# Patient Record
Sex: Female | Born: 1955 | Race: White | Hispanic: No | Smoking: Never smoker
Health system: Southern US, Community
[De-identification: ages and names within clinical notes are randomized; demographics above are authoritative.]

## PROBLEM LIST (undated history)

## (undated) DIAGNOSIS — J449 Chronic obstructive pulmonary disease, unspecified: Secondary | ICD-10-CM

## (undated) HISTORY — PX: ABDOMINAL SURGERY: SHX537

## (undated) HISTORY — PX: FRACTURE SURGERY: SHX138

## (undated) HISTORY — PX: TONSILLECTOMY: SUR1361

## (undated) HISTORY — PX: APPENDECTOMY: SHX54

## (undated) HISTORY — PX: ABDOMINAL HYSTERECTOMY: SHX81

---

## 2011-02-17 ENCOUNTER — Emergency Department (HOSPITAL_COMMUNITY): Payer: Self-pay

## 2011-02-17 ENCOUNTER — Emergency Department (HOSPITAL_COMMUNITY)
Admission: EM | Admit: 2011-02-17 | Discharge: 2011-02-17 | Disposition: A | Discharge status: Home / Self Care | Payer: Self-pay | Attending: Emergency Medicine | Admitting: Emergency Medicine

## 2011-02-17 ENCOUNTER — Encounter (HOSPITAL_COMMUNITY): Payer: Self-pay | Admitting: Emergency Medicine

## 2011-02-17 DIAGNOSIS — J4489 Other specified chronic obstructive pulmonary disease: Secondary | ICD-10-CM | POA: Insufficient documentation

## 2011-02-17 DIAGNOSIS — R351 Nocturia: Secondary | ICD-10-CM | POA: Insufficient documentation

## 2011-02-17 DIAGNOSIS — N390 Urinary tract infection, site not specified: Secondary | ICD-10-CM | POA: Insufficient documentation

## 2011-02-17 DIAGNOSIS — J449 Chronic obstructive pulmonary disease, unspecified: Secondary | ICD-10-CM | POA: Insufficient documentation

## 2011-02-17 DIAGNOSIS — R109 Unspecified abdominal pain: Secondary | ICD-10-CM | POA: Insufficient documentation

## 2011-02-17 DIAGNOSIS — Z87442 Personal history of urinary calculi: Secondary | ICD-10-CM | POA: Insufficient documentation

## 2011-02-17 DIAGNOSIS — R350 Frequency of micturition: Secondary | ICD-10-CM | POA: Insufficient documentation

## 2011-02-17 DIAGNOSIS — M545 Low back pain, unspecified: Secondary | ICD-10-CM | POA: Insufficient documentation

## 2011-02-17 DIAGNOSIS — R1032 Left lower quadrant pain: Secondary | ICD-10-CM | POA: Insufficient documentation

## 2011-02-17 HISTORY — DX: Chronic obstructive pulmonary disease, unspecified: J44.9

## 2011-02-17 LAB — URINALYSIS, ROUTINE W REFLEX MICROSCOPIC
Bilirubin Urine: NEGATIVE
Glucose, UA: NEGATIVE mg/dL
Hgb urine dipstick: NEGATIVE
Specific Gravity, Urine: 1.019 (ref 1.005–1.030)
pH: 6.5 (ref 5.0–8.0)

## 2011-02-17 LAB — URINE MICROSCOPIC-ADD ON

## 2011-02-17 MED ORDER — KETOROLAC TROMETHAMINE 60 MG/2ML IM SOLN
60.0000 mg | Freq: Once | INTRAMUSCULAR | Status: AC
  Administered 2011-02-17: 60 mg via INTRAMUSCULAR
  Filled 2011-02-17: qty 2

## 2011-02-17 MED ORDER — CYCLOBENZAPRINE HCL 5 MG PO TABS: 5.0000 mg | ORAL_TABLET | Freq: Three times a day (TID) | ORAL | Status: AC | PRN

## 2011-02-17 MED ORDER — CEPHALEXIN 500 MG PO CAPS: 500.0000 mg | ORAL_CAPSULE | Freq: Three times a day (TID) | ORAL | Status: AC

## 2011-02-17 MED ORDER — PHENAZOPYRIDINE HCL 200 MG PO TABS: 200.0000 mg | ORAL_TABLET | Freq: Three times a day (TID) | ORAL | Status: AC

## 2011-02-17 MED ORDER — PROMETHAZINE HCL 25 MG PO TABS: 25.0000 mg | ORAL_TABLET | Freq: Three times a day (TID) | ORAL | Status: AC | PRN

## 2011-02-17 NOTE — ED Notes (Signed)
The pt has had lt flank  And lt lower abd pain for 3 weeks.  Worse for the past 2  days

## 2011-02-17 NOTE — ED Provider Notes (Signed)
History     CSN: [id number]  Arrival date & time 02/17/11  1339   First MD Initiated Contact with Patient 02/17/11 1532      Chief Complaint  Patient presents with  . Abdominal Pain    (Consider location/radiation/quality/duration/timing/severity/associated sxs/prior treatment) HPI  Patient relates for the past 3 weeks she has been having pain in her lower back, she states she does not have dysuria but she has severe pain after she urinates. She states her urine has a very foul odor and she is having marked frequency and nocturia. She denies nausea, vomiting, fever. She also relates she's having pain in her left flank it's radiating around to her left lower quadrant that is not related to anything she does although sometimes when she moves it hurts more. She states she did have a kidney stone once over 30 years ago while she was pregnant. She's been taking cranberry juice and a zone of the counter without improvement.  ECP Dr. Brock Bad   Past Medical History  Diagnosis Date  . COPD (chronic obstructive pulmonary disease)     Past Surgical History  Procedure Date  . Abdominal surgery   . Abdominal hysterectomy   . Appendectomy   . Tonsillectomy   . Fracture surgery    bladder sling  No family history on file.  History  Substance Use Topics  . Smoking status: Never Smoker   . Smokeless tobacco: Not on file  . Alcohol Use: No    OB History    Grav Para Term Preterm Abortions TAB SAB Ect Mult Living                  Review of Systems  All other systems reviewed and are negative.    Allergies  Hydrocodone  Home Medications  No current outpatient prescriptions on file.  BP 124/73  Pulse 90  Temp(Src) 97.8 F (36.6 C) (Oral)  Resp 20  SpO2 96%  Vital signs normal    Physical Exam  Constitutional: She is oriented to person, place, and time. She appears well-developed and well-nourished.  Non-toxic appearance. She does not appear ill. No distress.  HENT:   Head: Normocephalic and atraumatic.  Right Ear: External ear normal.  Left Ear: External ear normal.  Nose: Nose normal. No mucosal edema or rhinorrhea.  Mouth/Throat: Oropharynx is clear and moist and mucous membranes are normal. No dental abscesses or uvula swelling.  Eyes: Conjunctivae and EOM are normal. Pupils are equal, round, and reactive to light.  Neck: Normal range of motion and full passive range of motion without pain. Neck supple.  Cardiovascular: Normal rate, regular rhythm and normal heart sounds.  Exam reveals no gallop and no friction rub.   No murmur heard. Pulmonary/Chest: Effort normal and breath sounds normal. No respiratory distress. She has no wheezes. She has no rhonchi. She has no rales. She exhibits no tenderness and no crepitus.  Abdominal: Soft. Normal appearance and bowel sounds are normal. She exhibits no distension. There is tenderness. There is no rebound and no guarding.       Patient has mild tenderness in her left lower quadrant. She has some tenderness in her left flank.  Musculoskeletal: Normal range of motion. She exhibits no edema and no tenderness.       Moves all extremities well.   Neurological: She is alert and oriented to person, place, and time. She has normal strength. No cranial nerve deficit.  Skin: Skin is warm, dry and intact. No rash  noted. No erythema. No pallor.  Psychiatric: She has a normal mood and affect. Her speech is normal and behavior is normal. Her mood appears not anxious.    ED Course  Procedures (including critical care time)  Patient given Toradol 60 mg IM for her flank pain.  Review of [place] controlled substance site shows she has hydrocodone filled in August 2012 #:15 and in October 2012 #15  Results for orders placed during the hospital encounter of 02/17/11  URINALYSIS, ROUTINE W REFLEX MICROSCOPIC      Component Value Range   Color, Urine YELLOW  YELLOW    APPearance CLOUDY (*) CLEAR    Specific Gravity,  Urine 1.019  1.005 - 1.030    pH 6.5  5.0 - 8.0    Glucose, UA NEGATIVE  NEGATIVE (mg/dL)   Hgb urine dipstick NEGATIVE  NEGATIVE    Bilirubin Urine NEGATIVE  NEGATIVE    Ketones, ur NEGATIVE  NEGATIVE (mg/dL)   Protein, ur NEGATIVE  NEGATIVE (mg/dL)   Urobilinogen, UA 1.0  0.0 - 1.0 (mg/dL)   Nitrite POSITIVE (*) NEGATIVE    Leukocytes, UA MODERATE (*) NEGATIVE   URINE MICROSCOPIC-ADD ON      Component Value Range   Squamous Epithelial / LPF FEW (*) RARE    WBC, UA 21-50  <3 (WBC/hpf)   Bacteria, UA MANY (*) RARE    Laboratory interpretation all normal except possible UTI   Ct Abdomen Pelvis Wo Contrast  02/17/2011  *RADIOLOGY REPORT*  Clinical Data: Left flank pain.  Left lower quadrant pain.  CT ABDOMEN AND PELVIS WITHOUT CONTRAST  Technique:  Multidetector CT imaging of the abdomen and pelvis was performed following the standard protocol without intravenous contrast.  Comparison: None.  Findings: No evidence of renal calculi or hydronephrosis.  No evidence of ureteral calculi or dilatation.  No bladder calculi identified.  Prior hysterectomy noted.  Adnexa are unremarkable in appearance.  The upper abdominal parenchymal organs have a normal appearance on this noncontrast study.  No soft tissue masses or lymphadenopathy are identified.  There is no evidence of inflammatory process or abnormal fluid collections. Mild diverticulosis noted at the junction of the descending and sigmoid colon, however there is no evidence of diverticulitis.  IMPRESSION:  1.  No evidence of urolithiasis, hydronephrosis, or other acute findings. 2.  Mild diverticulosis.  No radiographic evidence of diverticulitis.  Original Report Authenticated By: Danae Orleans, M.D.    Diagnoses that have been ruled out:  None  Diagnoses that are still under consideration:  None  Final diagnoses:  Urinary tract infection  Lt flank pain   New Prescriptions   CEPHALEXIN (KEFLEX) 500 MG CAPSULE    Take 1 capsule (500 mg  total) by mouth 3 (three) times daily.   CYCLOBENZAPRINE (FLEXERIL) 5 MG TABLET    Take 1 tablet (5 mg total) by mouth 3 (three) times daily as needed for muscle spasms.   PHENAZOPYRIDINE (PYRIDIUM) 200 MG TABLET    Take 1 tablet (200 mg total) by mouth 3 (three) times daily.   PROMETHAZINE (PHENERGAN) 25 MG TABLET    Take 1 tablet (25 mg total) by mouth every 8 (eight) hours as needed for nausea.   Plan discharge Devoria Albe, MD, FACEP       MDM          Ward Givens, MD 02/17/11 [phone]

## 2011-02-17 NOTE — ED Notes (Signed)
The pt just went ot c-t.  Med given before she left

## 2011-02-17 NOTE — ED Notes (Signed)
Hurts after she voids has a strong odor x 2 weeks

## 2017-08-09 ENCOUNTER — Ambulatory Visit (INDEPENDENT_AMBULATORY_CARE_PROVIDER_SITE_OTHER): Payer: Medicare Other | Admitting: Internal Medicine

## 2017-08-09 ENCOUNTER — Encounter: Payer: Self-pay | Admitting: Internal Medicine

## 2017-08-09 VITALS — BP 112/80 | HR 74 | Ht 67.0 in | Wt 243.6 lb

## 2017-08-09 DIAGNOSIS — R0609 Other forms of dyspnea: Secondary | ICD-10-CM

## 2017-08-09 DIAGNOSIS — J45991 Cough variant asthma: Secondary | ICD-10-CM

## 2017-08-09 LAB — NITRIC OXIDE: Nitric Oxide: 6

## 2017-08-09 MED ORDER — BUDESONIDE-FORMOTEROL FUMARATE 80-4.5 MCG/ACT IN AERO: 2.0000 | INHALATION_SPRAY | Freq: Two times a day (BID) | RESPIRATORY_TRACT | 0 refills | Status: DC

## 2017-08-09 MED ORDER — BUDESONIDE-FORMOTEROL FUMARATE 80-4.5 MCG/ACT IN AERO: 2.0000 | INHALATION_SPRAY | Freq: Two times a day (BID) | RESPIRATORY_TRACT | 11 refills | Status: AC

## 2017-08-09 MED ORDER — FAMOTIDINE 20 MG PO TABS: ORAL_TABLET | ORAL | 11 refills | Status: AC

## 2017-08-09 MED ORDER — PANTOPRAZOLE SODIUM 40 MG PO TBEC: 40.0000 mg | DELAYED_RELEASE_TABLET | Freq: Every day | ORAL | 2 refills | Status: AC

## 2017-08-09 MED ORDER — TRAMADOL HCL 50 MG PO TABS: 50.0000 mg | ORAL_TABLET | ORAL | 0 refills | Status: AC | PRN

## 2017-08-09 NOTE — Assessment & Plan Note (Signed)
Spirometry 08/09/2017  FEV1 2.63  (95%)  Ratio 84 s curvature p nothing prior  - 08/09/2017  Walked RA x 3 laps @ 185 ft each stopped due to  End of study, mod fast  pace, no  Desat/ min sob        Symptoms are markedly disproportionate to objective findings and not clear to what extent this is actually a pulmonary  problem but pt does appear to have difficult to sort out respiratory symptoms of unknown origin for which  DDX  = almost all start with A and  include Adherence, Ace Inhibitors, Acid Reflux, Active Sinus Disease, Alpha 1 Antitripsin deficiency, Anxiety masquerading as Airways dz,  ABPA,  Allergy(esp in young), Aspiration (esp in elderly), Adverse effects of meds,  Active smokers, A bunch of PE's/clot burden (a few small clots can't cause this syndrome unless there is already severe underlying pulm or vascular dz with poor reserve),  Anemia or thyroid disorder, plus two Bs  = Bronchiectasis and Beta blocker use..and one C= CHF     Adherence is always the initial "prime suspect" and is a multilayered concern that requires a "trust but verify" approach in every patient - starting with knowing how to use medications, especially inhalers, correctly, keeping up with refills and understanding the fundamental difference between maintenance and prns vs those medications only taken for a very short course and then stopped and not refilled.  - see hfa training  - return with all meds in hand using a trust but verify approach to confirm accurate Medication  Reconciliation The principal here is that until we are certain that the  patients are doing what we've asked, it makes no sense to ask them to do more.    ? Acid (or non-acid) GERD > always difficult to exclude as up to 75% of pts in some series report no assoc GI/ Heartburn symptoms> rec max (24h)  acid suppression and diet restrictions/ reviewed and instructions given in writing.  - Of the three most common causes of  Sub-acute / recurrent or chronic  cough, only one (GERD)  can actually contribute to/ trigger  the other two (asthma and post nasal drip syndrome)  and perpetuate the cylce of cough.  While not intuitively obvious, many patients with chronic low grade reflux do not cough until there is a primary insult that disturbs the protective epithelial barrier and exposes sensitive nerve endings.   This is typically viral but can due to PNDS and  either may apply here.   The point is that once this occurs, it is difficult to eliminate the cycle  using anything but a maximally effective acid suppression regimen at least in the short run, accompanied by an appropriate diet to address non acid GERD and control / eliminate the cough itself for at least 3 days with tramadol if needed    ? Allergy/ asthma >  See cough variant asthma vs uacs > low dose ics only for now   ? ABPA > just finished pred so will do next ov   ? Adverse drug effects > none listed   ? Anxiety/depression/ deconditioning > usually at the bottom of this list of usual suspects  and may interfere with adherence and also interpretation of response or lack thereof to symptom management which can be quite subjective.   ? Bronchiectasis > could have residual from pna in Feb 2019 but would need HRCT to establish and moot point for now

## 2017-08-09 NOTE — Patient Instructions (Addendum)
Plan A = Automatic = Symbicort 80 Take 2 puffs first thing in am and then another 2 puffs about 12 hours later.   Work on inhaler technique:  relax and gently blow all the way out then take a nice smooth deep breath back in, triggering the inhaler at same time you start breathing in.  Hold for up to 5 seconds if you can. Blow out thru nose. Rinse and gargle with water when done     Plan B = Backup Only use your albuterol as a rescue medication to be used if you can't catch your breath by resting or doing a relaxed purse lip breathing pattern.  - The less you use it, the better it will work when you need it. - Ok to use the inhaler up to 2 puffs  every 4 hours if you must but call for appointment if use goes up over your usual need - Don't leave home without it !!  (think of it like the spare tire for your car)   Plan C = Crisis - only use your albuterol nebulizer if you first try Plan B and it fails to help > ok to use the nebulizer up to every 4 hours but if start needing it regularly call for immediate appointment    Pantoprazole (protonix) 40 mg   Take  30-60 min before first meal of the day and Pepcid (famotidine)  20 mg one afer supper  until return to office - this is the best way to tell whether stomach acid is contributing to your problem.     GERD (REFLUX)  is an extremely common cause of respiratory symptoms just like yours , many times with no obvious heartburn at all.    It can be treated with medication, but also with lifestyle changes including elevation of the head of your bed (ideally with 6 inch  bed blocks),  Smoking cessation, avoidance of late meals, excessive alcohol, and avoid fatty foods, chocolate, peppermint, colas, red wine, and acidic juices such as orange juice.  NO MINT OR MENTHOL PRODUCTS SO NO COUGH DROPS   USE SUGARLESS CANDY INSTEAD (Jolley ranchers or Stover's or Life Savers) or even ice chips will also do - the key is to swallow to prevent all throat  clearing. NO OIL BASED VITAMINS - use powdered substitutes.      For cough stop mucinex and Take delsym two tsp every 12 hours and supplement if needed with  tramadol 50 mg up to 2 every 4 hours to suppress the urge to cough. Swallowing water and/or using ice chips/non mint and menthol containing candies (such as lifesavers or sugarless jolly ranchers) are also effective.  You should rest your voice and avoid activities that you know make you cough.  Once you have eliminated the cough for 3 straight days try reducing the tramadol first,  then the delsym as tolerated.     Please schedule a follow up office visit in 2 weeks, sooner if needed  with all medications /inhalers/ solutions in hand so we can verify exactly what you are taking. This includes all medications from all doctors and over the counters

## 2017-08-09 NOTE — Assessment & Plan Note (Addendum)
FENO 08/09/2017  =   6 -  08/09/2017  After extensive coaching inhaler device  effectiveness =    75% > try symb 80 2bid   Clearly this is not copd and not even sure how much asthma is present but  Adherence has been a big issue and starts with  inability to use HFA effectively and also  understand that SABA treats the symptoms but doesn't get to the underlying problem (inflammation).  I used  the analogy of putting steroid cream on a rash to help explain the meaning of topical therapy and the need to get the drug to the target tissue.    F/u in 2 weeks to regroup and in meantime try to eliminate cyclical cough component     Total time devoted to counseling  > 50 % of initial 60 min office visit:  review case with pt/daughter discussion of options/alternatives/ personally creating written customized instructions  in presence of pt  then going over those specific  Instructions directly with the pt including how to use all of the meds but in particular covering each new medication in detail and the difference between the maintenance= "automatic" meds and the prns using an action plan format for the latter (If this problem/symptom => do that organization reading Left to right).  Please see AVS from this visit for a full list of these instructions which I personally wrote for this pt and  are unique to this visit.
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
## 2017-08-23 ENCOUNTER — Ambulatory Visit (INDEPENDENT_AMBULATORY_CARE_PROVIDER_SITE_OTHER)
Admission: RE | Admit: 2017-08-23 | Discharge: 2017-08-23 | Disposition: A | Discharge status: Home / Self Care | Payer: Medicare Other | Source: Ambulatory Visit | Attending: Internal Medicine | Admitting: Internal Medicine

## 2017-08-23 ENCOUNTER — Encounter: Payer: Self-pay | Admitting: Internal Medicine

## 2017-08-23 ENCOUNTER — Ambulatory Visit (INDEPENDENT_AMBULATORY_CARE_PROVIDER_SITE_OTHER): Payer: Medicare Other | Admitting: Internal Medicine

## 2017-08-23 ENCOUNTER — Other Ambulatory Visit (INDEPENDENT_AMBULATORY_CARE_PROVIDER_SITE_OTHER): Payer: Medicare Other

## 2017-08-23 VITALS — BP 116/70 | HR 91 | Ht 67.0 in | Wt 242.0 lb

## 2017-08-23 DIAGNOSIS — R05 Cough: Secondary | ICD-10-CM | POA: Diagnosis not present

## 2017-08-23 DIAGNOSIS — R0609 Other forms of dyspnea: Secondary | ICD-10-CM | POA: Diagnosis not present

## 2017-08-23 DIAGNOSIS — J45991 Cough variant asthma: Secondary | ICD-10-CM

## 2017-08-23 DIAGNOSIS — R059 Cough, unspecified: Secondary | ICD-10-CM

## 2017-08-23 LAB — CBC WITH DIFFERENTIAL/PLATELET
Basophils Absolute: 0 10*3/uL (ref 0.0–0.1)
Basophils Relative: 0.8 % (ref 0.0–3.0)
EOS ABS: 0.2 10*3/uL (ref 0.0–0.7)
Eosinophils Relative: 4 % (ref 0.0–5.0)
HCT: 45.7 % (ref 36.0–46.0)
HEMOGLOBIN: 15.5 g/dL — AB (ref 12.0–15.0)
Lymphocytes Relative: 32.3 % (ref 12.0–46.0)
Lymphs Abs: 1.9 10*3/uL (ref 0.7–4.0)
MCHC: 34 g/dL (ref 30.0–36.0)
MCV: 88.5 fl (ref 78.0–100.0)
Monocytes Absolute: 0.5 10*3/uL (ref 0.1–1.0)
Monocytes Relative: 7.9 % (ref 3.0–12.0)
NEUTROS PCT: 55 % (ref 43.0–77.0)
Neutro Abs: 3.2 10*3/uL (ref 1.4–7.7)
Platelets: 260 10*3/uL (ref 150.0–400.0)
RBC: 5.17 Mil/uL — AB (ref 3.87–5.11)
RDW: 13.6 % (ref 11.5–15.5)
WBC: 5.8 10*3/uL (ref 4.0–10.5)
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
## 2017-08-23 NOTE — Patient Instructions (Addendum)
Symbicort 80 try to reduce and just Take 1 puffs first thing in am and then another 1 puffs about 12 hours later.    Work on inhaler technique:  relax and gently blow all the way out then take a nice smooth deep breath back in, triggering the inhaler at same time you start breathing in.  Hold for up to 5 seconds if you can. Blow out thru nose. Rinse and gargle with water when done  Continue reflux meds and diet/ lifestyle changes:    including elevation of the head of your bed (ideally with 6 inch  bed blocks),  Smoking cessation, avoidance of late meals, excessive alcohol, and avoid fatty foods, chocolate, peppermint, colas, red wine, and acidic juices such as orange juice.  NO MINT OR MENTHOL PRODUCTS SO NO COUGH DROPS  USE SUGARLESS CANDY INSTEAD (Jolley ranchers or Stover's or Life Savers) or even ice chips will also do - the key is to swallow to prevent all throat clearing. NO OIL BASED VITAMINS - use powdered substitutes.   For drainage / throat tickle try take CHLORPHENIRAMINE  4 mg - take one every 4 hours as needed - available over the counter- may cause drowsiness so start with just a bedtime dose or two and see how you tolerate it before trying in daytime    For cough > deslym 2 tsp every 12 hours and use the flutter valve every time you cough to prevent throat irritation    Please remember to go to the lab and x-ray department downstairs in the basement  for your tests - we will call you with the results when they are available.     Please schedule a follow up office visit in 4 weeks, sooner if needed  With your flutter valve and all active  medications /inhalers/ solutions in hand so we can verify exactly what you are taking. This includes all medications from all doctors and over the counters.

## 2017-08-24 ENCOUNTER — Encounter: Payer: Self-pay | Admitting: Internal Medicine

## 2017-08-24 LAB — RESPIRATORY ALLERGY PROFILE REGION II ~~LOC~~
Allergen, A. alternata, m6: <0.1 kU/L
Allergen, Cedar tree, t12: <0.1 kU/L
Allergen, Comm Silver Birch, t9: <0.1 kU/L
Allergen, Cottonwood, t14: <0.1 kU/L
Allergen, Mulberry, t76: <0.1 kU/L
Aspergillus fumigatus, m3: <0.1 kU/L
Bermuda Grass: <0.1 kU/L
Box Elder IgE: <0.1 kU/L
CLADOSPORIUM HERBARUM (M2) IGE: <0.1 kU/L
CLASS: 0
CLASS: 0
CLASS: 0
CLASS: 0
CLASS: 0
CLASS: 0
CLASS: 0
CLASS: 0
CLASS: 0
CLASS: 0
COMMON RAGWEED (SHORT) (W1) IGE: <0.1 kU/L
Class: 0
Class: 0
Class: 0
Class: 0
Class: 0
Class: 0
Class: 0
Class: 0
Class: 0
Class: 0
Class: 0
Class: 0
Class: 0
Class: 0
Dog Dander: <0.1 kU/L
Elm IgE: <0.1 kU/L
IgE (Immunoglobulin E), Serum: 16 kU/L (ref <=114)
Johnson Grass: <0.1 kU/L
Pecan/Hickory Tree IgE: <0.1 kU/L
Rough Pigweed  IgE: <0.1 kU/L
Sheep Sorrel IgE: <0.1 kU/L

## 2017-08-24 LAB — INTERPRETATION:

## 2017-08-24 NOTE — Assessment & Plan Note (Signed)
Spirometry 08/09/2017  FEV1 2.63  (95%)  Ratio 84 s curvature p nothing prior  - 08/09/2017  Walked RA x 3 laps @ 185 ft each stopped due to  End of study, mod fast  pace, no  Desat/ min sob  - 08/23/2017  Walked RA x 3 laps @ 185 ft each stopped due to  End of study, fast pace, no sob or desat  / some coughing   Strongly suspect obesity / deconditioning are the main issues here

## 2017-08-24 NOTE — Assessment & Plan Note (Addendum)
FENO 08/09/2017  =   6 -  08/09/2017    try symb 80 2bid  - 08/23/2017  After extensive coaching inhaler device  effectiveness =    75% from a baseline of 25% > try symb 80 one bid   She is behaving more like uacs now than asthma  Upper airway cough syndrome (previously labeled PNDS),  is so named because it's frequently impossible to sort out how much is  CR/sinusitis with freq throat clearing (which can be related to primary GERD)   vs  causing  secondary (" extra esophageal")  GERD from wide swings in gastric pressure that occur with throat clearing, often  promoting self use of mint and menthol lozenges that reduce the lower esophageal sphincter tone and exacerbate the problem further in a cyclical fashion.   These are the same pts (now being labeled as having "irritable larynx syndrome" by some cough centers) who not infrequently have a history of having failed to tolerate ace inhibitors,  dry powder inhalers (or even sometime ICS hfa) or biphosphonates or report having atypical/extraesophageal reflux symptoms that don't respond to standard doses of PPI  and are easily confused as having aecopd or asthma flares by even experienced allergists/ pulmonologists (myself included).     rec Reduce the symbicort 80 to one bid in the absence of convincing flare of asthma Max rx for gerd Sinus ct /allergy profile ordered Add 1st gen H1 blockers per guidelines  If tol and d/c all the otcs x delsym Use flutter valve to prevent airway trauma from coughing fits   Return 4 weeks with all meds in hand using a trust but verify approach to confirm accurate Medication  Reconciliation The principal here is that until we are certain that the  patients are doing what we've asked, it makes no sense to ask them to do more.    I had an extended discussion with the patient reviewing all relevant studies completed to date and  lasting 15 to 20 minutes of a 25 minute visit    See device teaching which extended face to  face time for this visit.  Each maintenance medication was reviewed in detail including emphasizing most importantly the difference between maintenance and prns and under what circumstances the prns are to be triggered using an action plan format that is not reflected in the computer generated alphabetically organized AVS which I have not found useful in most complex patients, especially with respiratory illnesses  Please see AVS for specific instructions unique to this visit that I personally wrote and verbalized to the the pt in detail and then reviewed with pt  by my nurse highlighting any  changes in therapy recommended at today's visit to their plan of care.

## 2017-08-24 NOTE — Progress Notes (Signed)
Spoke with pt and notified of results per Dr. Wert. Pt verbalized understanding and denied any questions. 

## 2017-09-11 ENCOUNTER — Ambulatory Visit (HOSPITAL_BASED_OUTPATIENT_CLINIC_OR_DEPARTMENT_OTHER)
Admission: RE | Admit: 2017-09-11 | Discharge: 2017-09-11 | Disposition: A | Discharge status: Home / Self Care | Payer: Medicare Other | Source: Ambulatory Visit | Attending: Internal Medicine | Admitting: Internal Medicine

## 2017-09-11 DIAGNOSIS — R059 Cough, unspecified: Secondary | ICD-10-CM

## 2017-09-11 DIAGNOSIS — J45991 Cough variant asthma: Secondary | ICD-10-CM | POA: Insufficient documentation

## 2017-09-11 DIAGNOSIS — R05 Cough: Secondary | ICD-10-CM

## 2017-09-11 NOTE — Progress Notes (Signed)
Spoke with pt and notified of results per Dr. Wert. Pt verbalized understanding and denied any questions. 

## 2017-09-24 ENCOUNTER — Ambulatory Visit: Payer: Medicare Other | Admitting: Internal Medicine

## 2018-11-07 IMAGING — CT CT PARANASAL SINUSES LIMITED
1 series · 12 of 14 positions shown, 15 images · non-contrast
Comparison: CT head 07/10/1999

CLINICAL DATA: Cough variant asthma

EXAM:
CT PARANASAL SINUS LIMITED WITHOUT CONTRAST
TECHNIQUE: Non-contiguous multidetector CT images of the paranasal sinuses were
obtained in a single plane without contrast.

[Series 3: limited sinus st · axial · 0.27mm/px · z∈[-126,-16]mm · 12 of 14 slices shown, 15 images]
[im 2/14  brain]
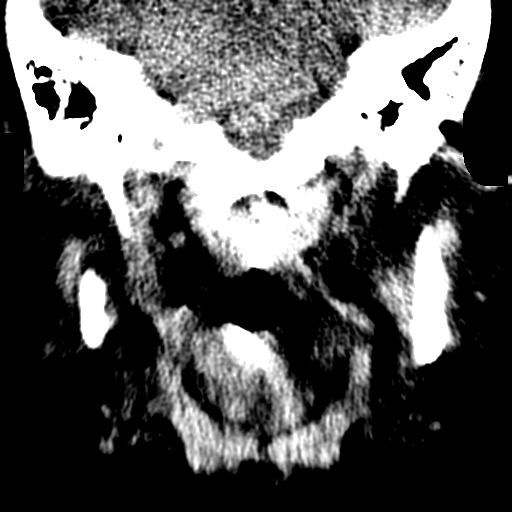
[im 2/14  bone]
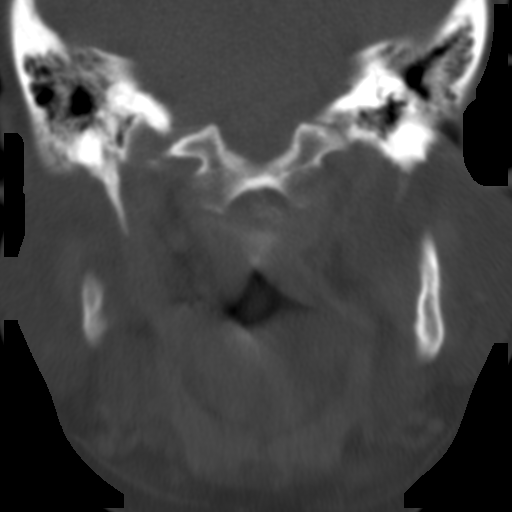
[im 3/14  bone]
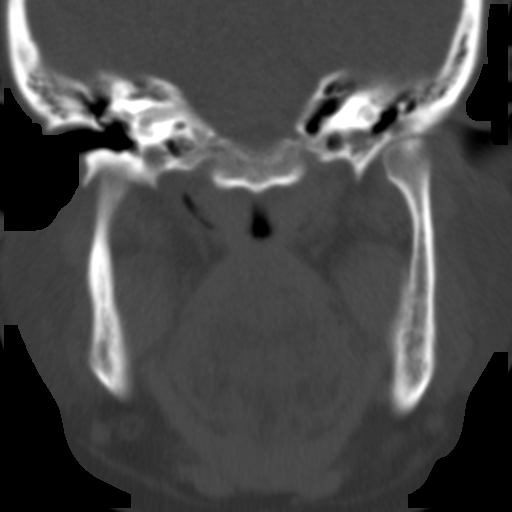
[im 4/14  bone]
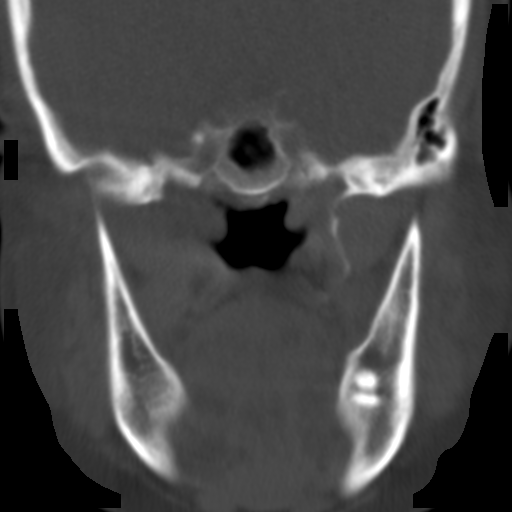
[im 5/14  bone]
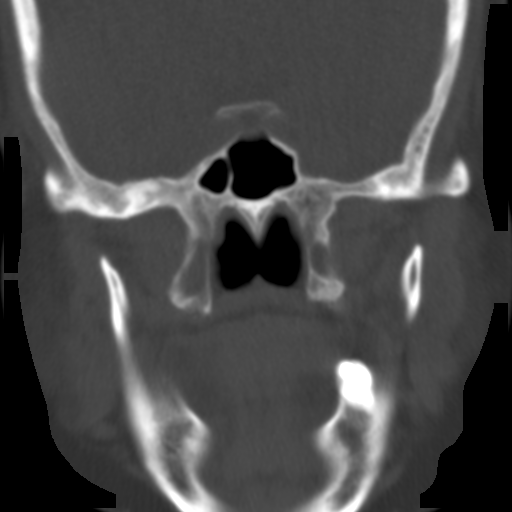
[im 6/14  brain]
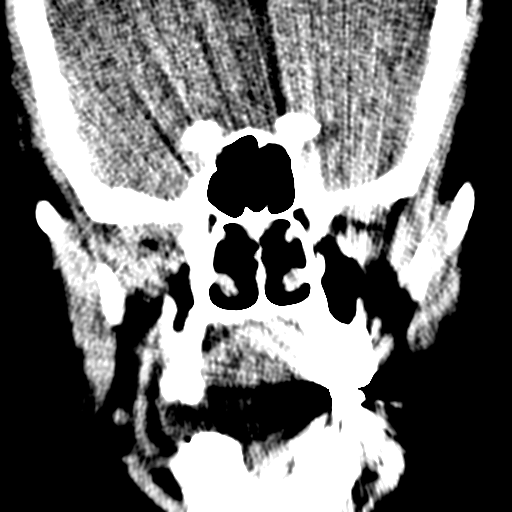
[im 6/14  bone]
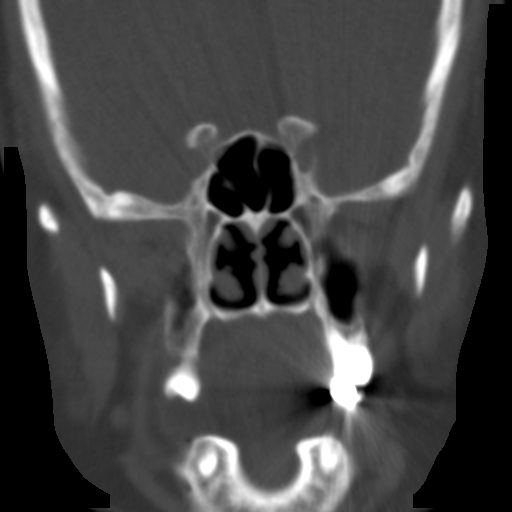
[im 7/14  bone]
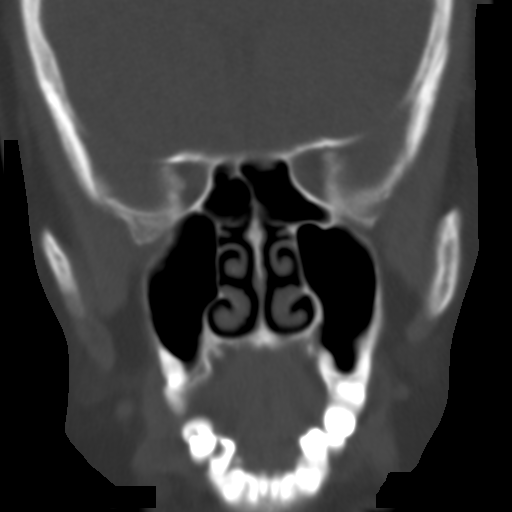
[im 8/14  bone]
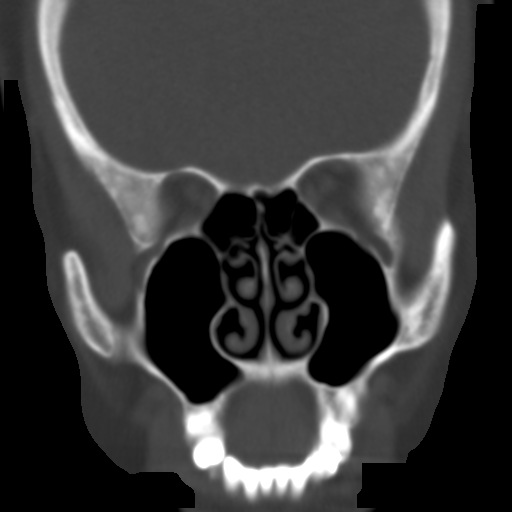
[im 9/14  bone]
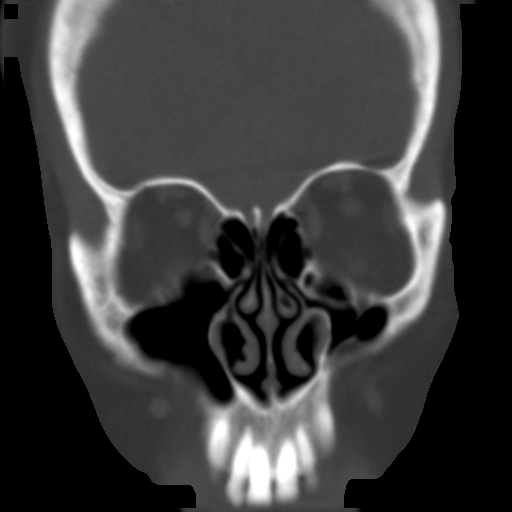
[im 10/14  brain]
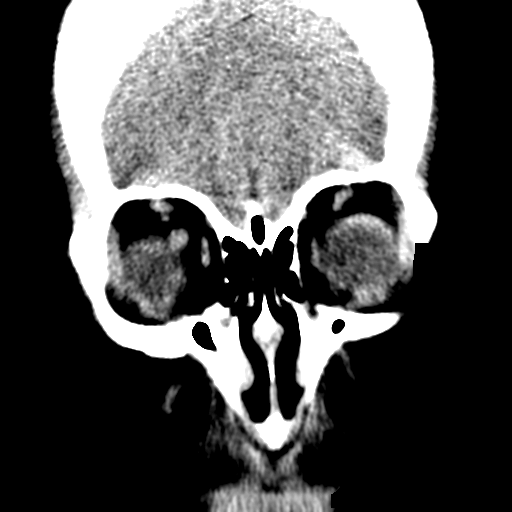
[im 10/14  bone]
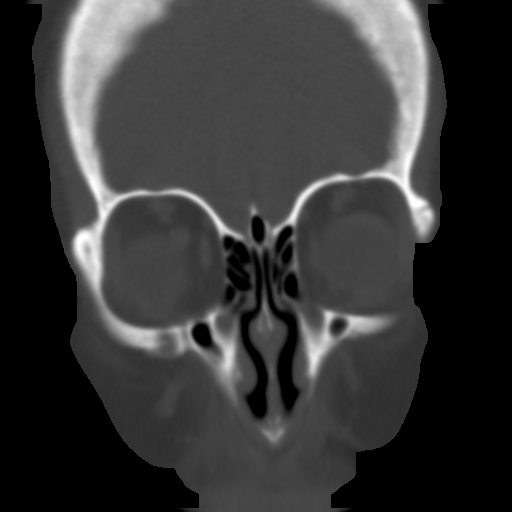
[im 11/14  bone]
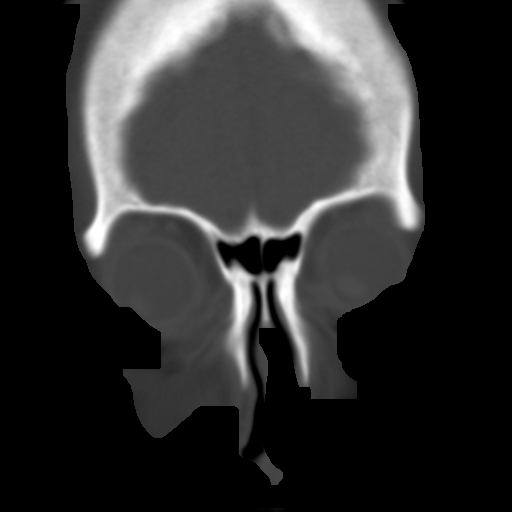
[im 12/14  bone]
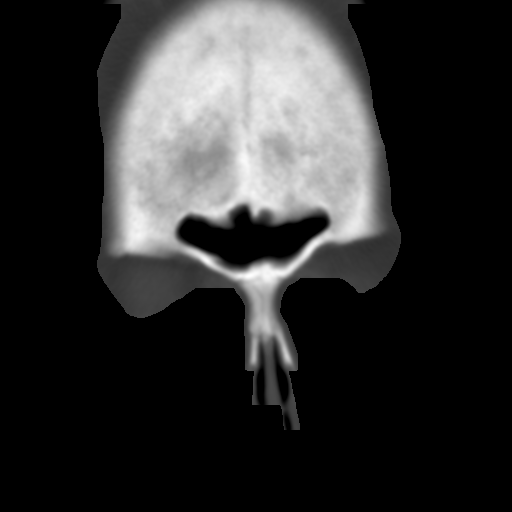
[im 13/14  bone]
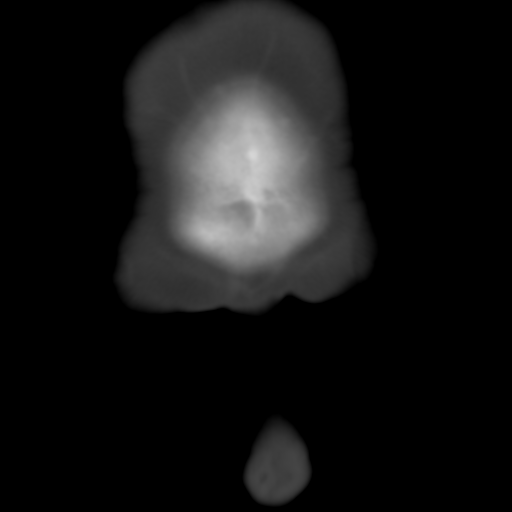

[12 of 14 positions shown; findings below may reference images not displayed]

FINDINGS: Paranasal sinuses are well developed and clear. Negative for mucosal
edema or air-fluid level. Nasal septum midline. No acute bony
abnormality. Regional soft tissues negative
IMPRESSION: Negative
